# Patient Record
Sex: Female | Born: 1975 | Race: Black or African American | Hispanic: No | Marital: Single | Smoking: Never smoker
Health system: Southern US, Community
[De-identification: ages and names within clinical notes are randomized; demographics above are authoritative.]

## PROBLEM LIST (undated history)

## (undated) DIAGNOSIS — J45909 Unspecified asthma, uncomplicated: Secondary | ICD-10-CM

## (undated) DIAGNOSIS — F419 Anxiety disorder, unspecified: Secondary | ICD-10-CM

---

## 2006-08-31 ENCOUNTER — Emergency Department: Payer: Self-pay | Admitting: Emergency Medicine

## 2006-08-31 ENCOUNTER — Other Ambulatory Visit: Payer: Self-pay

## 2006-12-17 ENCOUNTER — Other Ambulatory Visit: Payer: Self-pay

## 2006-12-17 ENCOUNTER — Emergency Department: Payer: Self-pay | Admitting: Unknown Physician Specialty

## 2007-05-07 ENCOUNTER — Emergency Department: Payer: Self-pay | Admitting: Emergency Medicine

## 2007-06-19 ENCOUNTER — Emergency Department: Payer: Self-pay | Admitting: Emergency Medicine

## 2007-06-19 ENCOUNTER — Other Ambulatory Visit: Payer: Self-pay

## 2007-07-11 ENCOUNTER — Emergency Department: Payer: Self-pay | Admitting: Emergency Medicine

## 2007-08-13 ENCOUNTER — Emergency Department: Payer: Self-pay | Admitting: Emergency Medicine

## 2007-10-02 ENCOUNTER — Other Ambulatory Visit: Payer: Self-pay

## 2007-10-02 ENCOUNTER — Emergency Department: Payer: Self-pay | Admitting: Emergency Medicine

## 2007-10-04 ENCOUNTER — Emergency Department: Payer: Self-pay | Admitting: Internal Medicine

## 2007-11-30 ENCOUNTER — Emergency Department: Payer: Self-pay | Admitting: Emergency Medicine

## 2008-08-30 ENCOUNTER — Emergency Department: Payer: Self-pay | Admitting: Emergency Medicine

## 2009-01-29 ENCOUNTER — Emergency Department: Payer: Self-pay | Admitting: Emergency Medicine

## 2009-11-03 ENCOUNTER — Emergency Department: Payer: Self-pay | Admitting: Unknown Physician Specialty

## 2010-03-17 ENCOUNTER — Emergency Department: Payer: Self-pay | Admitting: Emergency Medicine

## 2010-10-15 ENCOUNTER — Emergency Department: Payer: Self-pay | Admitting: Unknown Physician Specialty

## 2011-05-01 ENCOUNTER — Emergency Department: Payer: Self-pay | Admitting: Emergency Medicine

## 2011-12-10 ENCOUNTER — Emergency Department: Payer: Self-pay | Admitting: Emergency Medicine

## 2014-04-25 ENCOUNTER — Emergency Department: Payer: Self-pay | Admitting: Emergency Medicine

## 2015-06-09 ENCOUNTER — Emergency Department: Payer: Managed Care, Other (non HMO)

## 2015-06-09 ENCOUNTER — Encounter: Payer: Self-pay | Admitting: Emergency Medicine

## 2015-06-09 ENCOUNTER — Emergency Department
Admission: EM | Admit: 2015-06-09 | Discharge: 2015-06-09 | Disposition: A | Discharge status: Home / Self Care | Payer: Managed Care, Other (non HMO) | Attending: Emergency Medicine | Admitting: Emergency Medicine

## 2015-06-09 ENCOUNTER — Other Ambulatory Visit: Payer: Self-pay

## 2015-06-09 DIAGNOSIS — Z3202 Encounter for pregnancy test, result negative: Secondary | ICD-10-CM | POA: Diagnosis not present

## 2015-06-09 DIAGNOSIS — J209 Acute bronchitis, unspecified: Secondary | ICD-10-CM | POA: Insufficient documentation

## 2015-06-09 DIAGNOSIS — R05 Cough: Secondary | ICD-10-CM | POA: Diagnosis present

## 2015-06-09 HISTORY — DX: Unspecified asthma, uncomplicated: J45.909

## 2015-06-09 LAB — COMPREHENSIVE METABOLIC PANEL
ALBUMIN: 4 g/dL (ref 3.5–5.0)
ALK PHOS: 49 U/L (ref 38–126)
ALT: 28 U/L (ref 14–54)
ANION GAP: 6 (ref 5–15)
AST: 28 U/L (ref 15–41)
BILIRUBIN TOTAL: 0.2 mg/dL — AB (ref 0.3–1.2)
BUN: 7 mg/dL (ref 6–20)
CO2: 29 mmol/L (ref 22–32)
Calcium: 9.5 mg/dL (ref 8.9–10.3)
Chloride: 105 mmol/L (ref 101–111)
Creatinine, Ser: 0.85 mg/dL (ref 0.44–1.00)
GFR calc Af Amer: >60 mL/min (ref >60)
GFR calc non Af Amer: >60 mL/min (ref >60)
GLUCOSE: 126 mg/dL — AB (ref 65–99)
POTASSIUM: 3.6 mmol/L (ref 3.5–5.1)
SODIUM: 140 mmol/L (ref 135–145)
TOTAL PROTEIN: 7.2 g/dL (ref 6.5–8.1)

## 2015-06-09 LAB — CBC
HCT: 36.6 % (ref 35.0–47.0)
Hemoglobin: 12.3 g/dL (ref 12.0–16.0)
MCH: 30.8 pg (ref 26.0–34.0)
MCHC: 33.5 g/dL (ref 32.0–36.0)
MCV: 91.9 fL (ref 80.0–100.0)
PLATELETS: 216 10*3/uL (ref 150–440)
RBC: 3.98 MIL/uL (ref 3.80–5.20)
RDW: 13 % (ref 11.5–14.5)
WBC: 5.5 10*3/uL (ref 3.6–11.0)

## 2015-06-09 LAB — TROPONIN I
Troponin I: <0.03 ng/mL (ref <0.031)
Troponin I: <0.03 ng/mL (ref <0.031)

## 2015-06-09 LAB — POCT PREGNANCY, URINE: PREG TEST UR: NEGATIVE

## 2015-06-09 LAB — FIBRIN DERIVATIVES D-DIMER (ARMC ONLY): FIBRIN DERIVATIVES D-DIMER (ARMC): 496 (ref 0–499)

## 2015-06-09 MED ORDER — AZITHROMYCIN 250 MG PO TABS: 500.0000 mg | ORAL_TABLET | Freq: Every day | ORAL | Status: AC

## 2015-06-09 MED ORDER — AZITHROMYCIN 250 MG PO TABS
500.0000 mg | ORAL_TABLET | Freq: Once | ORAL | Status: AC
  Administered 2015-06-09: 500 mg via ORAL
  Filled 2015-06-09: qty 2

## 2015-06-09 NOTE — ED Notes (Signed)
Pt to ED with c/o left sided chest pain that has been intermitted x 2 days, also states today she had some left arm pain, denies any pain at present, also has had productive cough and dizziness

## 2015-06-09 NOTE — ED Provider Notes (Signed)
[facility] Emergency Department Provider Note  ____________________________________________  Time seen: 7:45 PM  I have reviewed the triage vital signs and the nursing notes.   HISTORY  Chief Complaint Chest Pain      HPI SHAMICA MOREE is a 39 y.o. female presents with intermittent left-sided chest pain 2 days accompanied by dizziness. In addition patient admits to a productive cough with onset 3 days ago. Patient denies any fever no nausea vomiting or diaphoresis. Patient states of note that her 68-year-old son was recently diagnosed with bronchitis. Regarding cardiac potential etiology patient has a family history of father has had 7 myocardial infarction his first of which was at the age of 47. Patient denies any leg pain or swelling.    Past Medical History  Diagnosis Date  . Asthma     There are no active problems to display for this patient.   Past Surgical History  Procedure Laterality Date  . Cesarean section      No current outpatient prescriptions on file.  Allergies Sulfa antibiotics  No family history on file.  Social History Social History  Substance Use Topics  . Smoking status: Never Smoker   . Smokeless tobacco: None  . Alcohol Use: No    Review of Systems  Constitutional: Negative for fever. Eyes: Negative for visual changes. ENT: Negative for sore throat. Cardiovascular: Positive for chest pain. Respiratory: Positive for shortness of breath and cough Gastrointestinal: Negative for abdominal pain, vomiting and diarrhea. Genitourinary: Negative for dysuria. Musculoskeletal: Negative for back pain. Skin: Negative for rash. Neurological: Negative for headaches, focal weakness or numbness.   10-point ROS otherwise negative.  ____________________________________________   PHYSICAL EXAM:  VITAL SIGNS: ED Triage Vitals  Enc Vitals Group     BP 06/09/15 1631 129/71 mmHg     Pulse Rate 06/09/15 1631 88   Resp 06/09/15 1631 18     Temp 06/09/15 1631 98.1 F (36.7 C)     Temp Source 06/09/15 1631 Oral     SpO2 06/09/15 1631 98 %     Weight 06/09/15 1631 133 lb (60.328 kg)     Height 06/09/15 1631 5' (1.524 m)     Head Cir --      Peak Flow --      Pain Score --      Pain Loc --      Pain Edu? --      Excl. in GC? --      Constitutional: Alert and oriented. Well appearing and in no distress. Eyes: Conjunctivae are normal. PERRL. Normal extraocular movements. ENT   Head: Normocephalic and atraumatic.   Nose: No congestion/rhinnorhea.   Mouth/Throat: Mucous membranes are moist.   Neck: No stridor. Hematological/Lymphatic/Immunilogical: No cervical lymphadenopathy. Cardiovascular: Normal rate, regular rhythm. Normal and symmetric distal pulses are present in all extremities. No murmurs, rubs, or gallops. Respiratory: Normal respiratory effort without tachypnea nor retractions. Breath sounds are clear and equal bilaterally. No wheezes/rales/rhonchi. Gastrointestinal: Soft and nontender. No distention. There is no CVA tenderness. Genitourinary: deferred Musculoskeletal: Nontender with normal range of motion in all extremities. No joint effusions.  No lower extremity tenderness nor edema. Neurologic:  Normal speech and language. No gross focal neurologic deficits are appreciated. Speech is normal.  Skin:  Skin is warm, dry and intact. No rash noted. Psychiatric: Mood and affect are normal. Speech and behavior are normal. Patient exhibits appropriate insight and judgment.  ____________________________________________    LABS (pertinent positives/negatives)  Labs Reviewed  COMPREHENSIVE  METABOLIC PANEL - Abnormal; Notable for the following:    Glucose, Bld 126 (*)    Total Bilirubin 0.2 (*)    All other components within normal limits  CBC  TROPONIN I  TROPONIN I  FIBRIN DERIVATIVES D-DIMER ([facility] ONLY)  POC URINE PREG, ED     ___  RADIOLOGY    DG Chest 2 View  (Final result) Result time: 06/09/15 17:12:12   Final result by Rad Results In Interface (06/09/15 17:12:12)   Narrative:   CLINICAL DATA: Chest pain and productive cough for 2 days.  EXAM: CHEST 2 VIEW  COMPARISON: None.  FINDINGS: The cardiac silhouette, mediastinal and hilar contours are within normal limits. The lungs are clear. No pleural effusion. The bony thorax is intact.  IMPRESSION: No acute cardiopulmonary findings.   Electronically Signed By: Rudie Meyer M.D. On: 06/09/2015 17:12         INITIAL IMPRESSION / ASSESSMENT AND PLAN / ED COURSE  Pertinent labs & imaging results that were available during my care of the patient were reviewed by me and considered in my medical decision making (see chart for details).  Patient's cardiac enzymes negative 2 along with unremarkable EKG. I suspect the patient's pain is most likely secondary to bronchitis given sick contact with the same and productive cough.  ____________________________________________   FINAL CLINICAL IMPRESSION(S) / ED DIAGNOSES  Final diagnoses:  Acute bronchitis, unspecified organism        Darci Current, MD 06/09/15 2155

## 2015-06-09 NOTE — Discharge Instructions (Signed)

## 2015-09-09 ENCOUNTER — Encounter: Payer: Self-pay | Admitting: *Deleted

## 2015-09-09 ENCOUNTER — Emergency Department: Payer: Managed Care, Other (non HMO)

## 2015-09-09 ENCOUNTER — Emergency Department
Admission: EM | Admit: 2015-09-09 | Discharge: 2015-09-09 | Disposition: A | Discharge status: Home / Self Care | Payer: Managed Care, Other (non HMO) | Attending: Emergency Medicine | Admitting: Emergency Medicine

## 2015-09-09 DIAGNOSIS — M546 Pain in thoracic spine: Secondary | ICD-10-CM | POA: Diagnosis not present

## 2015-09-09 DIAGNOSIS — M79602 Pain in left arm: Secondary | ICD-10-CM | POA: Diagnosis present

## 2015-09-09 DIAGNOSIS — R079 Chest pain, unspecified: Secondary | ICD-10-CM | POA: Diagnosis not present

## 2015-09-09 LAB — BASIC METABOLIC PANEL
ANION GAP: 7 (ref 5–15)
BUN: 10 mg/dL (ref 6–20)
CALCIUM: 9.3 mg/dL (ref 8.9–10.3)
CO2: 28 mmol/L (ref 22–32)
Chloride: 105 mmol/L (ref 101–111)
Creatinine, Ser: 0.77 mg/dL (ref 0.44–1.00)
Glucose, Bld: 104 mg/dL — ABNORMAL HIGH (ref 65–99)
Potassium: 3.5 mmol/L (ref 3.5–5.1)
SODIUM: 140 mmol/L (ref 135–145)

## 2015-09-09 LAB — CBC
HCT: 39.4 % (ref 35.0–47.0)
HEMOGLOBIN: 12.7 g/dL (ref 12.0–16.0)
MCH: 29.9 pg (ref 26.0–34.0)
MCHC: 32.3 g/dL (ref 32.0–36.0)
MCV: 92.6 fL (ref 80.0–100.0)
Platelets: 274 10*3/uL (ref 150–440)
RBC: 4.25 MIL/uL (ref 3.80–5.20)
RDW: 13.2 % (ref 11.5–14.5)
WBC: 5.3 10*3/uL (ref 3.6–11.0)

## 2015-09-09 LAB — TROPONIN I

## 2015-09-09 MED ORDER — IBUPROFEN 800 MG PO TABS: 800.0000 mg | ORAL_TABLET | Freq: Three times a day (TID) | ORAL | Status: DC | PRN

## 2015-09-09 MED ORDER — DIAZEPAM 5 MG PO TABS: 5.0000 mg | ORAL_TABLET | Freq: Three times a day (TID) | ORAL | Status: DC | PRN

## 2015-09-09 NOTE — ED Provider Notes (Signed)
[facility] Emergency Department Provider Note     Time seen: ----------------------------------------- 9:39 AM on 09/09/2015 -----------------------------------------    I have reviewed the triage vital signs and the nursing notes.   HISTORY  Chief Complaint Chest Pain and Arm Pain    HPI Wendy Collins is a 39 y.o. female who presents ER for left arm pain and chest pain started this morning. Patient has never had a history of this before, has had chest pain in the past. The chest pain today is more like back pain. She states this time appears hard for her, but doesn't really think this is anxiety related. She denies any recent illness or cough. Nothing makes her symptoms better or worse.   Past Medical History  Diagnosis Date  . Asthma     There are no active problems to display for this patient.   Past Surgical History  Procedure Laterality Date  . Cesarean section      Allergies Sulfa antibiotics  Social History Social History  Substance Use Topics  . Smoking status: Never Smoker   . Smokeless tobacco: None  . Alcohol Use: No    Review of Systems Constitutional: Negative for fever. Eyes: Negative for visual changes. ENT: Negative for sore throat. Cardiovascular: Positive for chest pain Respiratory: Negative for shortness of breath. Gastrointestinal: Negative for abdominal pain, vomiting and diarrhea. Genitourinary: Negative for dysuria. Musculoskeletal: Positive for back pain, left arm pain Skin: Negative for rash. Neurological: Negative for headaches, focal weakness or numbness.  10-point ROS otherwise negative.  ____________________________________________   PHYSICAL EXAM:  VITAL SIGNS: ED Triage Vitals  Enc Vitals Group     BP 09/09/15 0929 129/79 mmHg     Pulse Rate 09/09/15 0929 79     Resp 09/09/15 0929 20     Temp 09/09/15 0929 97.6 F (36.4 C)     Temp Source 09/09/15 0929 Oral     SpO2 09/09/15 0929  100 %     Weight 09/09/15 0929 126 lb (57.153 kg)     Height 09/09/15 0929 5' (1.524 m)     Head Cir --      Peak Flow --      Pain Score 09/09/15 0931 5     Pain Loc --      Pain Edu? --      Excl. in GC? --     Constitutional: Alert and oriented. Well appearing and in no distress. Eyes: Conjunctivae are normal. PERRL. Normal extraocular movements. ENT   Head: Normocephalic and atraumatic.   Nose: No congestion/rhinnorhea.   Mouth/Throat: Mucous membranes are moist.   Neck: No stridor. Cardiovascular: Normal rate, regular rhythm. Normal and symmetric distal pulses are present in all extremities. No murmurs, rubs, or gallops. Respiratory: Normal respiratory effort without tachypnea nor retractions. Breath sounds are clear and equal bilaterally. No wheezes/rales/rhonchi. Gastrointestinal: Soft and nontender. No distention. No abdominal bruits.  Musculoskeletal: Nontender with normal range of motion in all extremities. No joint effusions.  No lower extremity tenderness nor edema. Neurologic:  Normal speech and language. No gross focal neurologic deficits are appreciated. Speech is normal. No gait instability. Skin:  Skin is warm, dry and intact. No rash noted. Psychiatric: Mood and affect are normal. Speech and behavior are normal. Patient exhibits appropriate insight and judgment. ____________________________________________  EKG: Interpreted by me. Normal sinus rhythm with sinus arrhythmia. Rate is 80 bpm, normal PR interval, normal QS with, normal QT interval. Possible septal infarct age and determinate. Nonspecific T-wave  changes.  ____________________________________________  ED COURSE:  Pertinent labs & imaging results that were available during my care of the patient were reviewed by me and considered in my medical decision making (see chart for details). Patient looks well, low risk for ACS. She does have a family history of cardiac disease but no other risk  factors personally ____________________________________________    LABS (pertinent positives/negatives)  Labs Reviewed  BASIC METABOLIC PANEL - Abnormal; Notable for the following:    Glucose, Bld 104 (*)    All other components within normal limits  CBC  TROPONIN I    RADIOLOGY  Chest x-ray IMPRESSION: No edema or consolidation ____________________________________________  FINAL ASSESSMENT AND PLAN  Back pain, left arm pain  Plan: Patient with labs and imaging as dictated above. Patient is low risk heart score and therefore low risk of ACS. This is likely anxiety or musculoskeletal in origin. I will prescribe Motrin and Valium for her to take as needed only.   Emily FilbertWilliams, Jonathan E, MD   Emily FilbertJonathan E Williams, MD 09/09/15 [phone]

## 2015-09-09 NOTE — ED Notes (Signed)
Pt reports left arm pain with chest pain started this morning, pt denies any other symptoms

## 2015-09-09 NOTE — Discharge Instructions (Signed)
Back Pain, Adult °Back pain is very common in adults. The cause of back pain is rarely dangerous and the pain often gets better over time. The cause of your back pain may not be known. Some common causes of back pain include: °· Strain of the muscles or ligaments supporting the spine. °· Wear and tear (degeneration) of the spinal disks. °· Arthritis. °· Direct injury to the back. °For many people, back pain may return. Since back pain is rarely dangerous, most people can learn to manage this condition on their own. °HOME CARE INSTRUCTIONS °Watch your back pain for any changes. The following actions may help to lessen any discomfort you are feeling: °· Remain active. It is stressful on your back to sit or stand in one place for long periods of time. Do not sit, drive, or stand in one place for more than 30 minutes at a time. Take short walks on even surfaces as soon as you are able. Try to increase the length of time you walk each day. °· Exercise regularly as directed by your health care provider. Exercise helps your back heal faster. It also helps avoid future injury by keeping your muscles strong and flexible. °· Do not stay in bed. Resting more than 1-2 days can delay your recovery. °· Pay attention to your body when you bend and lift. The most comfortable positions are those that put less stress on your recovering back. Always use proper lifting techniques, including: °· Bending your knees. °· Keeping the load close to your body. °· Avoiding twisting. °· Find a comfortable position to sleep. Use a firm mattress and lie on your side with your knees slightly bent. If you lie on your back, put a pillow under your knees. °· Avoid feeling anxious or stressed. Stress increases muscle tension and can worsen back pain. It is important to recognize when you are anxious or stressed and learn ways to manage it, such as with exercise. °· Take medicines only as directed by your health care provider. Over-the-counter  medicines to reduce pain and inflammation are often the most helpful. Your health care provider may prescribe muscle relaxant drugs. These medicines help dull your pain so you can more quickly return to your normal activities and healthy exercise. °· Apply ice to the injured area: °· Put ice in a plastic bag. °· Place a towel between your skin and the bag. °· Leave the ice on for 20 minutes, 2-3 times a day for the first 2-3 days. After that, ice and heat may be alternated to reduce pain and spasms. °· Maintain a healthy weight. Excess weight puts extra stress on your back and makes it difficult to maintain good posture. °SEEK MEDICAL CARE IF: °· You have pain that is not relieved with rest or medicine. °· You have increasing pain going down into the legs or buttocks. °· You have pain that does not improve in one week. °· You have night pain. °· You lose weight. °· You have a fever or chills. °SEEK IMMEDIATE MEDICAL CARE IF:  °· You develop new bowel or bladder control problems. °· You have unusual weakness or numbness in your arms or legs. °· You develop nausea or vomiting. °· You develop abdominal pain. °· You feel faint. °  °This information is not intended to replace advice given to you by your health care provider. Make sure you discuss any questions you have with your health care provider. °  °Document Released: 09/10/2005 Document Revised: 10/01/2014 Document Reviewed: 01/12/2014 °Elsevier Interactive Patient Education ©2016 [employer] °Thoracic Strain °A thoracic strain, which is sometimes called a mid-back strain, is an injury to the muscles or tendons that attach to the upper part of your back behind your chest. This type of injury occurs when a muscle is overstretched or overloaded.  °Thoracic strains can range from mild to severe. Mild strains may involve stretching a muscle or tendon without tearing it. These injuries may heal in 1-2 weeks. More severe strains involve tearing of muscle fibers or  tendons. These will cause more pain and may take 6-8 weeks to heal. °CAUSES °This condition may be caused by: °· An injury in which a sudden force is placed on the muscle. °· Exercising without properly warming up. °· Overuse of the muscle. °· Improper form during certain movements. °· Other injuries that surround or cause stress on the mid-back, causing a strain on the muscles. °In some cases, the cause may not be known. °RISK FACTORS °This injury is more common in: °· Athletes. °· People with obesity. °SYMPTOMS °The main symptom of this condition is pain, especially with movement. Other symptoms include: °· Bruising. °· Swelling. °· Spasm. °DIAGNOSIS °This condition may be diagnosed with a physical exam. X-rays may be taken to check for a fracture. °TREATMENT °This condition may be treated with: °· Resting and icing the injured area. °· Physical therapy. This will involve doing stretching and strengthening exercises. °· Medicines for pain and inflammation. °HOME CARE INSTRUCTIONS °· Rest as needed. Follow instructions from your health care provider about any restrictions on activity. °· If directed, apply ice to the injured area: °¨ Put ice in a plastic bag. °¨ Place a towel between your skin and the bag. °¨ Leave the ice on for 20 minutes, 2-3 times per day. °· Take over-the-counter and prescription medicines only as told by your health care provider. °· Begin doing exercises as told by your health care provider or physical therapist. °· Always warm up properly before physical activity or sports. °· Bend your knees before you lift heavy objects. °· Keep all follow-up visits as told by your health care provider. This is important. °SEEK MEDICAL CARE IF: °· Your pain is not helped by medicine. °· Your pain, bruising, or swelling is getting worse. °· You have a fever. °SEEK IMMEDIATE MEDICAL CARE IF: °· You have shortness of breath. °· You have chest pain. °· You develop numbness or weakness in your legs. °· You  have involuntary loss of urine (urinary incontinence). °  °This information is not intended to replace advice given to you by your health care provider. Make sure you discuss any questions you have with your health care provider. °  °Document Released: 12/01/2003 Document Revised: 06/01/2015 Document Reviewed: 11/04/2014 °Elsevier Interactive Patient Education ©2016 [employer] Inc. ° °

## 2015-09-09 NOTE — ED Notes (Signed)
Pt back from x-ray.

## 2015-10-11 ENCOUNTER — Emergency Department: Payer: Managed Care, Other (non HMO)

## 2015-10-11 ENCOUNTER — Encounter: Payer: Self-pay | Admitting: Emergency Medicine

## 2015-10-11 ENCOUNTER — Emergency Department
Admission: EM | Admit: 2015-10-11 | Discharge: 2015-10-11 | Disposition: A | Discharge status: Home / Self Care | Payer: Managed Care, Other (non HMO) | Attending: Emergency Medicine | Admitting: Emergency Medicine

## 2015-10-11 DIAGNOSIS — S30811A Abrasion of abdominal wall, initial encounter: Secondary | ICD-10-CM | POA: Insufficient documentation

## 2015-10-11 DIAGNOSIS — Z3202 Encounter for pregnancy test, result negative: Secondary | ICD-10-CM | POA: Insufficient documentation

## 2015-10-11 DIAGNOSIS — Y998 Other external cause status: Secondary | ICD-10-CM | POA: Diagnosis not present

## 2015-10-11 DIAGNOSIS — S2091XA Abrasion of unspecified parts of thorax, initial encounter: Secondary | ICD-10-CM

## 2015-10-11 DIAGNOSIS — S3991XA Unspecified injury of abdomen, initial encounter: Secondary | ICD-10-CM | POA: Diagnosis present

## 2015-10-11 DIAGNOSIS — S80212A Abrasion, left knee, initial encounter: Secondary | ICD-10-CM | POA: Diagnosis not present

## 2015-10-11 DIAGNOSIS — Y92410 Unspecified street and highway as the place of occurrence of the external cause: Secondary | ICD-10-CM | POA: Insufficient documentation

## 2015-10-11 DIAGNOSIS — S4992XA Unspecified injury of left shoulder and upper arm, initial encounter: Secondary | ICD-10-CM | POA: Diagnosis not present

## 2015-10-11 DIAGNOSIS — R109 Unspecified abdominal pain: Secondary | ICD-10-CM

## 2015-10-11 DIAGNOSIS — Y9389 Activity, other specified: Secondary | ICD-10-CM | POA: Insufficient documentation

## 2015-10-11 LAB — POCT PREGNANCY, URINE: Preg Test, Ur: NEGATIVE

## 2015-10-11 MED ORDER — CYCLOBENZAPRINE HCL 10 MG PO TABS: 10.0000 mg | ORAL_TABLET | Freq: Every day | ORAL | Status: AC

## 2015-10-11 MED ORDER — NAPROXEN 500 MG PO TABS: 500.0000 mg | ORAL_TABLET | Freq: Two times a day (BID) | ORAL | Status: AC
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
## 2015-10-11 NOTE — Discharge Instructions (Signed)
Abrasion An abrasion is a cut or scrape on the surface of your skin. An abrasion does not go through all of the layers of your skin. It is important to take good care of your abrasion to prevent infection. HOME CARE Medicines  Take or apply medicines only as told by your doctor.  If you were prescribed an antibiotic ointment, finish all of it even if you start to feel better. Wound Care  Clean the wound with mild soap and water 2-3 times per day or as told by your doctor. Pat your wound dry with a clean towel. Do not rub it.  There are many ways to close and cover a wound. Follow instructions from your doctor about:  How to take care of your wound.  When and how you should change your bandage (dressing).  When and how you should take off your dressing.  Check your wound every day for signs of infection. Watch for:  Redness, swelling, or pain.  Fluid, blood, or pus. General Instructions  Keep the dressing dry as told by your doctor. Do not take baths, swim, use a hot tub, or do anything that would put your wound underwater until your doctor says it is okay.  If there is swelling, raise (elevate) the injured area above the level of your heart while you are sitting or lying down.  Keep all follow-up visits as told by your doctor. This is important. GET HELP IF:  You were given a tetanus shot and you have any of these where the needle went in:  Swelling.  Very bad pain.  Redness.  Bleeding.  Medicine does not help your pain.  You have any of these at the site of the wound:  More redness.  More swelling.  More pain. GET HELP RIGHT AWAY IF:  You have a red streak going away from your wound.  You have a fever.  You have fluid, blood, or pus coming from your wound.  There is a bad smell coming from your wound.   This information is not intended to replace advice given to you by your health care provider. Make sure you discuss any questions you have with your  health care provider.   Document Released: 02/27/2008 Document Revised: 01/25/2015 Document Reviewed: 09/08/2014 Elsevier Interactive Patient Education 2016 [employer]  Cancer Antigen 19-9 Some forms of cancer produce a protein that appears in the blood when you have the disease (cancer marker or tumor marker). Measuring this protein can help diagnose certain types of cancer. Cancer antigen 19-9 (CA 19-9) is a cancer marker for pancreatic cancer. CA 19-9 is consistently higher in people with advanced pancreatic cancer. CA 19-9 can also be higher in people with other cancers and diseases. Sometimes people who do not have cancer or another disease have a small amount of CA 19-9 in their blood. You may have this test if you have symptoms of pancreatic cancer, such as:   Jaundice.  Weight loss.  Nausea.  Abdominal pain. If you are diagnosed with pancreatic cancer, you may also have this test to monitor your treatment. If your level of CA 19-9 continues to go up, it could mean your treatment is not working. If your level of CA 19-9 gets lower, the treatment is most likely working. You may also have this test to determine whether a cancer has come back after treatment.  This test requires a blood sample drawn from a vein in your hand or arm. RESULTS It is your responsibility  to obtain your test results. Ask the lab or department performing the test when and how you will get your results. Contact your health care provider to discuss any questions you have about your results.  The results of this test will be a range of values. CA 19-9 is measured in units per milliliter (units/mL).  Range of Normal Values Ranges for normal values vary among different labs and hospitals. You should always check with your health care provider after having lab work or other tests done to discuss whether your values are considered within normal limits.  A normal value for CA 19-9 is 37 units/mL or less. A level  that is outside the range of normal may indicate a problem. Meaning of Results Outside Normal Range Values A level of CA 19-9 in your blood that is higher than 37 units/mL could indicate you have pancreatic cancer. It can also mean you have another type of cancer. These include:  Lung cancer.  Gallbladder cancer.  Colorectal cancer. A high level of CA 19-9 can also indicate a condition that is not cancer, such as:   Liver disease.  Cystic fibrosis.  Pancreatitis.  Gallstones.   This information is not intended to replace advice given to you by your health care provider. Make sure you discuss any questions you have with your health care provider.   Document Released: 10/02/2004 Document Revised: 10/01/2014 Document Reviewed: 01/06/2014 Elsevier Interactive Patient Education 2016 [employer]  Abrasion An abrasion is a cut or scrape on the outer surface of your skin. An abrasion does not extend through all of the layers of your skin. It is important to care for your abrasion properly to prevent infection. CAUSES Most abrasions are caused by falling on or gliding across the ground or another surface. When your skin rubs on something, the outer and inner layer of skin rubs off.  SYMPTOMS A cut or scrape is the main symptom of this condition. The scrape may be bleeding, or it may appear red or pink. If there was an associated fall, there may be an underlying bruise. DIAGNOSIS An abrasion is diagnosed with a physical exam. TREATMENT Treatment for this condition depends on how large and deep the abrasion is. Usually, your abrasion will be cleaned with water and mild soap. This removes any dirt or debris that may be stuck. An antibiotic ointment may be applied to the abrasion to help prevent infection. A bandage (dressing) may be placed on the abrasion to keep it clean. You may also need a tetanus shot. HOME CARE INSTRUCTIONS Medicines  Take or apply medicines only as directed by  your health care provider.  If you were prescribed an antibiotic ointment, finish all of it even if you start to feel better. Wound Care  Clean the wound with mild soap and water 2-3 times per day or as directed by your health care provider. Pat your wound dry with a clean towel. Do not rub it.  There are many different ways to close and cover a wound. Follow instructions from your health care provider about:  Wound care.  Dressing changes and removal.  Check your wound every day for signs of infection. Watch for:  Redness, swelling, or pain.  Fluid, blood, or pus. General Instructions  Keep the dressing dry as directed by your health care provider. Do not take baths, swim, use a hot tub, or do anything that would put your wound underwater until your health care provider approves.  If there is swelling,  raise (elevate) the injured area above the level of your heart while you are sitting or lying down.  Keep all follow-up visits as directed by your health care provider. This is important. SEEK MEDICAL CARE IF:  You received a tetanus shot and you have swelling, severe pain, redness, or bleeding at the injection site.  Your pain is not controlled with medicine.  You have increased redness, swelling, or pain at the site of your wound. SEEK IMMEDIATE MEDICAL CARE IF:  You have a red streak going away from your wound.  You have a fever.  You have fluid, blood, or pus coming from your wound.  You notice a bad smell coming from your wound or your dressing.   This information is not intended to replace advice given to you by your health care provider. Make sure you discuss any questions you have with your health care provider.   Document Released: 06/20/2005 Document Revised: 06/01/2015 Document Reviewed: 09/08/2014 Elsevier Interactive Patient Education [employer]
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
## 2015-12-13 ENCOUNTER — Emergency Department: Payer: Managed Care, Other (non HMO)

## 2015-12-13 ENCOUNTER — Emergency Department
Admission: EM | Admit: 2015-12-13 | Discharge: 2015-12-13 | Disposition: A | Discharge status: Home / Self Care | Payer: Managed Care, Other (non HMO) | Attending: Emergency Medicine | Admitting: Emergency Medicine

## 2015-12-13 ENCOUNTER — Encounter: Payer: Self-pay | Admitting: Emergency Medicine

## 2015-12-13 DIAGNOSIS — Z79899 Other long term (current) drug therapy: Secondary | ICD-10-CM | POA: Diagnosis not present

## 2015-12-13 DIAGNOSIS — R42 Dizziness and giddiness: Secondary | ICD-10-CM | POA: Diagnosis not present

## 2015-12-13 DIAGNOSIS — M25519 Pain in unspecified shoulder: Secondary | ICD-10-CM | POA: Insufficient documentation

## 2015-12-13 DIAGNOSIS — R079 Chest pain, unspecified: Secondary | ICD-10-CM

## 2015-12-13 DIAGNOSIS — Z791 Long term (current) use of non-steroidal anti-inflammatories (NSAID): Secondary | ICD-10-CM | POA: Diagnosis not present

## 2015-12-13 DIAGNOSIS — R51 Headache: Secondary | ICD-10-CM | POA: Diagnosis not present

## 2015-12-13 LAB — CBC
HCT: 39.4 % (ref 35.0–47.0)
HEMOGLOBIN: 13.4 g/dL (ref 12.0–16.0)
MCH: 30.6 pg (ref 26.0–34.0)
MCHC: 34 g/dL (ref 32.0–36.0)
MCV: 90.1 fL (ref 80.0–100.0)
PLATELETS: 274 10*3/uL (ref 150–440)
RBC: 4.37 MIL/uL (ref 3.80–5.20)
RDW: 12.6 % (ref 11.5–14.5)
WBC: 8 10*3/uL (ref 3.6–11.0)

## 2015-12-13 LAB — BASIC METABOLIC PANEL
ANION GAP: 10 (ref 5–15)
BUN: 10 mg/dL (ref 6–20)
CHLORIDE: 100 mmol/L — AB (ref 101–111)
CO2: 22 mmol/L (ref 22–32)
Calcium: 8.2 mg/dL — ABNORMAL LOW (ref 8.9–10.3)
Creatinine, Ser: 0.84 mg/dL (ref 0.44–1.00)
GFR calc non Af Amer: >60 mL/min (ref >60)
Glucose, Bld: 108 mg/dL — ABNORMAL HIGH (ref 65–99)
Potassium: 2.6 mmol/L — CL (ref 3.5–5.1)
SODIUM: 132 mmol/L — AB (ref 135–145)

## 2015-12-13 LAB — TROPONIN I: Troponin I: <0.03 ng/mL (ref <0.031)

## 2015-12-13 MED ORDER — POTASSIUM CHLORIDE CRYS ER 20 MEQ PO TBCR
40.0000 meq | EXTENDED_RELEASE_TABLET | Freq: Once | ORAL | Status: AC
  Administered 2015-12-13: 40 meq via ORAL

## 2015-12-13 MED ORDER — POTASSIUM CHLORIDE CRYS ER 20 MEQ PO TBCR
40.0000 meq | EXTENDED_RELEASE_TABLET | Freq: Once | ORAL | Status: AC
  Administered 2015-12-13: 40 meq via ORAL
  Filled 2015-12-13: qty 2

## 2015-12-13 MED ORDER — IBUPROFEN 800 MG PO TABS
800.0000 mg | ORAL_TABLET | Freq: Once | ORAL | Status: AC
  Administered 2015-12-13: 800 mg via ORAL
  Filled 2015-12-13: qty 1

## 2015-12-13 MED ORDER — POTASSIUM CHLORIDE CRYS ER 20 MEQ PO TBCR
EXTENDED_RELEASE_TABLET | ORAL | Status: AC
  Administered 2015-12-13: 40 meq via ORAL
  Filled 2015-12-13: qty 2

## 2015-12-13 NOTE — ED Notes (Signed)
Patient ambulatory to triage with steady gait, without difficulty or distress noted; pt reports left upper chest pain since last Thursday;denies accomp symptoms

## 2015-12-13 NOTE — ED Notes (Signed)
Pt reports having stabbing pain in left chest area radiating around her left breast. Pt denies any other symptoms associated with her CP.

## 2015-12-13 NOTE — ED Provider Notes (Signed)
[facility] Emergency Department Provider Note  ____________________________________________  Time seen: Approximately 5:13 AM  I have reviewed the triage vital signs and the nursing notes.   HISTORY  Chief Complaint Chest Pain    HPI Wendy Collins is a 40 y.o. female who comes into the hospital today with some chest pain and tenderness since Thursday. The patient reports that the pain is in her left upper chest and radiates around her breast and into her back. The patient reports that at the sharp nagging pain which she hasn't taken anything. The patient reports that she's had some lightheadedness and headache but no shortness of breath, no nausea vomiting, no sweats. The patient reports her pain is a 5 out of 10 in intensity. She reports that it is tender when you palpate and she is unsure what going on. The patient denies trauma but does have a child that is almost 2 which she lifts on a regular basis. The patient reports that she does have an appointment to see her doctor in the morning but the pain was so severe last night she decided to come in and get checked out.   Past Medical History  Diagnosis Date  . Asthma     There are no active problems to display for this patient.   Past Surgical History  Procedure Laterality Date  . Cesarean section      Current Outpatient Rx  Name  Route  Sig  Dispense  Refill  . methylPREDNISolone (MEDROL DOSEPAK) 4 MG TBPK tablet      FPD      0   . cyclobenzaprine (FLEXERIL) 10 MG tablet   Oral   Take 1 tablet (10 mg total) by mouth at bedtime.   10 tablet   0   . naproxen (NAPROSYN) 500 MG tablet   Oral   Take 1 tablet (500 mg total) by mouth 2 (two) times daily with a meal.   30 tablet   0     Allergies Sulfa antibiotics  No family history on file.  Social History Social History  Substance Use Topics  . Smoking status: Never Smoker   . Smokeless tobacco: None  . Alcohol Use: No     Review of Systems Constitutional: No fever/chills Eyes: No visual changes. ENT: No sore throat. Cardiovascular:  chest pain. Respiratory: Denies shortness of breath. Gastrointestinal: No abdominal pain.  No nausea, no vomiting.  No diarrhea.  No constipation. Genitourinary: Negative for dysuria. Musculoskeletal: Scapula pain Skin: Negative for rash. Neurological: Headache and lightheadedness  10-point ROS otherwise negative.  ____________________________________________   PHYSICAL EXAM:  VITAL SIGNS: ED Triage Vitals  Enc Vitals Group     BP 12/13/15 0159 142/84 mmHg     Pulse Rate 12/13/15 0159 100     Resp 12/13/15 0159 20     Temp 12/13/15 0159 98 F (36.7 C)     Temp Source 12/13/15 0159 Oral     SpO2 12/13/15 0159 100 %     Weight 12/13/15 0159 133 lb (60.328 kg)     Height 12/13/15 0159 5' (1.524 m)     Head Cir --      Peak Flow --      Pain Score 12/13/15 0157 10     Pain Loc --      Pain Edu? --      Excl. in GC? --     Constitutional: Alert and oriented. Well appearing and in no acute distress. Eyes: Conjunctivae  are normal. PERRL. EOMI. Head: Atraumatic. Nose: No congestion/rhinnorhea. Mouth/Throat: Mucous membranes are moist.  Oropharynx non-erythematous. Cardiovascular: Normal rate, regular rhythm. Grossly normal heart sounds.  Good peripheral circulation. Left chest tender to palpation above breast and below breast as well as along the side and into the scapula. Respiratory: Normal respiratory effort.  No retractions. Lungs CTAB. Gastrointestinal: Soft and nontender. No distention. Positive bowel sounds Musculoskeletal: No lower extremity tenderness nor edema.   Neurologic:  Normal speech and language.  Skin:  Skin is warm, dry and intact. Marland Kitchen. Psychiatric: Mood and affect are normal.   ____________________________________________   LABS (all labs ordered are listed, but only abnormal results are displayed)  Labs Reviewed  BASIC METABOLIC  PANEL - Abnormal; Notable for the following:    Sodium 132 (*)    Potassium 2.6 (*)    Chloride 100 (*)    Glucose, Bld 108 (*)    Calcium 8.2 (*)    All other components within normal limits  CBC  TROPONIN I  TROPONIN I   ____________________________________________  EKG  ED ECG REPORT I, Rebecka ApleyWebster,  Hania Cerone P, the attending physician, personally viewed and interpreted this ECG.   Date: 12/13/2015  EKG Time: 202  Rate: 101  Rhythm: sinus tachycardia  Axis: normal  Intervals:none  ST&T Change: St segment depression II, III, avf, V3, V4, V5, V6  ____________________________________________  RADIOLOGY  CXR: No active cardiopulmonary disease ____________________________________________   PROCEDURES  Procedure(s) performed: None  Critical Care performed: No  ____________________________________________   INITIAL IMPRESSION / ASSESSMENT AND PLAN / ED COURSE  Pertinent labs & imaging results that were available during my care of the patient were reviewed by me and considered in my medical decision making (see chart for details).  This is a 40 year old female who comes to the hospital with chest pain. The patient was having tenderness in her chest wall and hasn't taken anything for the pain. The patient's potassium is low so I did give her dose of oral potassium 40 mEq. The patient also received a dose of ibuprofen. We'll reassess the patient's troponin reassess the patient afterwards.  The patient's repeat troponin is unremarkable. The patient did receive her second dose of oral potassium 40 mEq. The patient discharged home to follow-up with her primary care physician as scheduled today. ____________________________________________   FINAL CLINICAL IMPRESSION(S) / ED DIAGNOSES  Final diagnoses:  Chest pain, unspecified chest pain type      Rebecka ApleyAllison P Cyprian Gongaware, MD 12/13/15 [phone]

## 2015-12-13 NOTE — Discharge Instructions (Signed)
Nonspecific Chest Pain  °Chest pain can be caused by many different conditions. There is always a chance that your pain could be related to something serious, such as a heart attack or a blood clot in your lungs. Chest pain can also be caused by conditions that are not life-threatening. If you have chest pain, it is very important to follow up with your health care provider. °CAUSES  °Chest pain can be caused by: °· Heartburn. °· Pneumonia or bronchitis. °· Anxiety or stress. °· Inflammation around your heart (pericarditis) or lung (pleuritis or pleurisy). °· A blood clot in your lung. °· A collapsed lung (pneumothorax). It can develop suddenly on its own (spontaneous pneumothorax) or from trauma to the chest. °· Shingles infection (varicella-zoster virus). °· Heart attack. °· Damage to the bones, muscles, and cartilage that make up your chest wall. This can include: °¨ Bruised bones due to injury. °¨ Strained muscles or cartilage due to frequent or repeated coughing or overwork. °¨ Fracture to one or more ribs. °¨ Sore cartilage due to inflammation (costochondritis). °RISK FACTORS  °Risk factors for chest pain may include: °· Activities that increase your risk for trauma or injury to your chest. °· Respiratory infections or conditions that cause frequent coughing. °· Medical conditions or overeating that can cause heartburn. °· Heart disease or family history of heart disease. °· Conditions or health behaviors that increase your risk of developing a blood clot. °· Having had chicken pox (varicella zoster). °SIGNS AND SYMPTOMS °Chest pain can feel like: °· Burning or tingling on the surface of your chest or deep in your chest. °· Crushing, pressure, aching, or squeezing pain. °· Dull or sharp pain that is worse when you move, cough, or take a deep breath. °· Pain that is also felt in your back, neck, shoulder, or arm, or pain that spreads to any of these areas. °Your chest pain may come and go, or it may stay  constant. °DIAGNOSIS °Lab tests or other studies may be needed to find the cause of your pain. Your health care provider may have you take a test called an ambulatory ECG (electrocardiogram). An ECG records your heartbeat patterns at the time the test is performed. You may also have other tests, such as: °· Transthoracic echocardiogram (TTE). During echocardiography, sound waves are used to create a picture of all of the heart structures and to look at how blood flows through your heart. °· Transesophageal echocardiogram (TEE). This is a more advanced imaging test that obtains images from inside your body. It allows your health care provider to see your heart in finer detail. °· Cardiac monitoring. This allows your health care provider to monitor your heart rate and rhythm in real time. °· Holter monitor. This is a portable device that records your heartbeat and can help to diagnose abnormal heartbeats. It allows your health care provider to track your heart activity for several days, if needed. °· Stress tests. These can be done through exercise or by taking medicine that makes your heart beat more quickly. °· Blood tests. °· Imaging tests. °TREATMENT  °Your treatment depends on what is causing your chest pain. Treatment may include: °· Medicines. These may include: °¨ Acid blockers for heartburn. °¨ Anti-inflammatory medicine. °¨ Pain medicine for inflammatory conditions. °¨ Antibiotic medicine, if an infection is present. °¨ Medicines to dissolve blood clots. °¨ Medicines to treat coronary artery disease. °· Supportive care for conditions that do not require medicines. This may include: °¨ Resting. °¨ Applying heat   or cold packs to injured areas. °¨ Limiting activities until pain decreases. °HOME CARE INSTRUCTIONS °· If you were prescribed an antibiotic medicine, finish it all even if you start to feel better. °· Avoid any activities that bring on chest pain. °· Do not use any tobacco products, including  cigarettes, chewing tobacco, or electronic cigarettes. If you need help quitting, ask your health care provider. °· Do not drink alcohol. °· Take medicines only as directed by your health care provider. °· Keep all follow-up visits as directed by your health care provider. This is important. This includes any further testing if your chest pain does not go away. °· If heartburn is the cause for your chest pain, you may be told to keep your head raised (elevated) while sleeping. This reduces the chance that acid will go from your stomach into your esophagus. °· Make lifestyle changes as directed by your health care provider. These may include: °¨ Getting regular exercise. Ask your health care provider to suggest some activities that are safe for you. °¨ Eating a heart-healthy diet. A registered dietitian can help you to learn healthy eating options. °¨ Maintaining a healthy weight. °¨ Managing diabetes, if necessary. °¨ Reducing stress. °SEEK MEDICAL CARE IF: °· Your chest pain does not go away after treatment. °· You have a rash with blisters on your chest. °· You have a fever. °SEEK IMMEDIATE MEDICAL CARE IF:  °· Your chest pain is worse. °· You have an increasing cough, or you cough up blood. °· You have severe abdominal pain. °· You have severe weakness. °· You faint. °· You have chills. °· You have sudden, unexplained chest discomfort. °· You have sudden, unexplained discomfort in your arms, back, neck, or jaw. °· You have shortness of breath at any time. °· You suddenly start to sweat, or your skin gets clammy. °· You feel nauseous or you vomit. °· You suddenly feel light-headed or dizzy. °· Your heart begins to beat quickly, or it feels like it is skipping beats. °These symptoms may represent a serious problem that is an emergency. Do not wait to see if the symptoms will go away. Get medical help right away. Call your local emergency services (911 in the [place] Do not drive yourself to the hospital. °  °This  information is not intended to replace advice given to you by your health care provider. Make sure you discuss any questions you have with your health care provider. °  °Document Released: 06/20/2005 Document Revised: 10/01/2014 Document Reviewed: 04/16/2014 °Elsevier Interactive Patient Education ©2016 [employer] Inc[employer] ° °Chest Wall Pain °Chest wall pain is pain in or around the bones and muscles of your chest. Sometimes, an injury causes this pain. Sometimes, the cause may not be known. This pain may take several weeks or longer to get better. °HOME CARE INSTRUCTIONS  °Pay attention to any changes in your symptoms. Take these actions to help with your pain:  °· Rest as told by your health care provider.   °· Avoid activities that cause pain. These include any activities that use your chest muscles or your abdominal and side muscles to lift heavy items.    °· If directed, apply ice to the painful area: °¨ Put ice in a plastic bag. °¨ Place a towel between your skin and the bag. °¨ Leave the ice on for 20 minutes, 2-3 times per day. °· Take over-the-counter and prescription medicines only as told by your health care provider. °· Do not use tobacco products, including cigarettes,   chewing tobacco, and e-cigarettes. If you need help quitting, ask your health care provider. °· Keep all follow-up visits as told by your health care provider. This is important. °SEEK MEDICAL CARE IF: °· You have a fever. °· Your chest pain becomes worse. °· You have new symptoms. °SEEK IMMEDIATE MEDICAL CARE IF: °· You have nausea or vomiting. °· You feel sweaty or light-headed. °· You have a cough with phlegm (sputum) or you cough up blood. °· You develop shortness of breath. °  °This information is not intended to replace advice given to you by your health care provider. Make sure you discuss any questions you have with your health care provider. °  °Document Released: 09/10/2005 Document Revised: 06/01/2015 Document Reviewed:  12/06/2014 °Elsevier Interactive Patient Education ©2016 [employer]. ° °

## 2016-03-28 ENCOUNTER — Emergency Department
Admission: EM | Admit: 2016-03-28 | Discharge: 2016-03-28 | Disposition: A | Discharge status: Home / Self Care | Payer: Managed Care, Other (non HMO) | Attending: Emergency Medicine | Admitting: Emergency Medicine

## 2016-03-28 ENCOUNTER — Emergency Department: Payer: Managed Care, Other (non HMO)

## 2016-03-28 ENCOUNTER — Encounter: Payer: Self-pay | Admitting: Emergency Medicine

## 2016-03-28 DIAGNOSIS — J45909 Unspecified asthma, uncomplicated: Secondary | ICD-10-CM | POA: Diagnosis not present

## 2016-03-28 DIAGNOSIS — J0120 Acute ethmoidal sinusitis, unspecified: Secondary | ICD-10-CM | POA: Insufficient documentation

## 2016-03-28 DIAGNOSIS — R42 Dizziness and giddiness: Secondary | ICD-10-CM

## 2016-03-28 DIAGNOSIS — J322 Chronic ethmoidal sinusitis: Secondary | ICD-10-CM

## 2016-03-28 DIAGNOSIS — R55 Syncope and collapse: Secondary | ICD-10-CM | POA: Diagnosis present

## 2016-03-28 HISTORY — DX: Anxiety disorder, unspecified: F41.9

## 2016-03-28 LAB — URINALYSIS COMPLETE WITH MICROSCOPIC (ARMC ONLY)
BILIRUBIN URINE: NEGATIVE
GLUCOSE, UA: NEGATIVE mg/dL
KETONES UR: NEGATIVE mg/dL
LEUKOCYTES UA: NEGATIVE
NITRITE: NEGATIVE
Protein, ur: NEGATIVE mg/dL
SPECIFIC GRAVITY, URINE: 1.014 (ref 1.005–1.030)
pH: 6 (ref 5.0–8.0)

## 2016-03-28 LAB — BASIC METABOLIC PANEL
ANION GAP: 8 (ref 5–15)
BUN: 12 mg/dL (ref 6–20)
CO2: 26 mmol/L (ref 22–32)
Calcium: 8.9 mg/dL (ref 8.9–10.3)
Chloride: 104 mmol/L (ref 101–111)
Creatinine, Ser: 0.88 mg/dL (ref 0.44–1.00)
Glucose, Bld: 87 mg/dL (ref 65–99)
POTASSIUM: 3.1 mmol/L — AB (ref 3.5–5.1)
SODIUM: 138 mmol/L (ref 135–145)

## 2016-03-28 LAB — CBC
HEMATOCRIT: 37.6 % (ref 35.0–47.0)
Hemoglobin: 13.1 g/dL (ref 12.0–16.0)
MCH: 31.8 pg (ref 26.0–34.0)
MCHC: 34.8 g/dL (ref 32.0–36.0)
MCV: 91.5 fL (ref 80.0–100.0)
Platelets: 228 10*3/uL (ref 150–440)
RBC: 4.11 MIL/uL (ref 3.80–5.20)
RDW: 12.8 % (ref 11.5–14.5)
WBC: 5 10*3/uL (ref 3.6–11.0)

## 2016-03-28 LAB — POC URINE PREG, ED: PREG TEST UR: NEGATIVE

## 2016-03-28 LAB — GLUCOSE, CAPILLARY: Glucose-Capillary: 83 mg/dL (ref 65–99)

## 2016-03-28 MED ORDER — PREDNISONE 10 MG (21) PO TBPK
ORAL_TABLET | ORAL | Status: AC
Start: 2016-03-28 — End: ?

## 2016-03-28 MED ORDER — AMOXICILLIN-POT CLAVULANATE 875-125 MG PO TABS: 1.0000 | ORAL_TABLET | Freq: Two times a day (BID) | ORAL | Status: AC

## 2016-03-28 NOTE — Discharge Instructions (Signed)
Dizziness °Dizziness is a common problem. It is a feeling of unsteadiness or light-headedness. You may feel like you are about to faint. Dizziness can lead to injury if you stumble or fall. Anyone can become dizzy, but dizziness is more common in older adults. This condition can be caused by a number of things, including medicines, dehydration, or illness. °HOME CARE INSTRUCTIONS °Taking these steps may help with your condition: °Eating and Drinking °· Drink enough fluid to keep your urine clear or pale yellow. This helps to keep you from becoming dehydrated. Try to drink more clear fluids, such as water. °· Do not drink alcohol. °· Limit your caffeine intake if directed by your health care provider. °· Limit your salt intake if directed by your health care provider. °Activity °· Avoid making quick movements. °· Rise slowly from chairs and steady yourself until you feel okay. °· In the morning, first sit up on the side of the bed. When you feel okay, stand slowly while you hold onto something until you know that your balance is fine. °· Move your legs often if you need to stand in one place for a long time. Tighten and relax your muscles in your legs while you are standing. °· Do not drive or operate heavy machinery if you feel dizzy. °· Avoid bending down if you feel dizzy. Place items in your home so that they are easy for you to reach without leaning over. °Lifestyle °· Do not use any tobacco products, including cigarettes, chewing tobacco, or electronic cigarettes. If you need help quitting, ask your health care provider. °· Try to reduce your stress level, such as with yoga or meditation. Talk with your health care provider if you need help. °General Instructions °· Watch your dizziness for any changes. °· Take medicines only as directed by your health care provider. Talk with your health care provider if you think that your dizziness is caused by a medicine that you are taking. °· Tell a friend or a family  member that you are feeling dizzy. If he or she notices any changes in your behavior, have this person call your health care provider. °· Keep all follow-up visits as directed by your health care provider. This is important. °SEEK MEDICAL CARE IF: °· Your dizziness does not go away. °· Your dizziness or light-headedness gets worse. °· You feel nauseous. °· You have reduced hearing. °· You have new symptoms. °· You are unsteady on your feet or you feel like the room is spinning. °SEEK IMMEDIATE MEDICAL CARE IF: °· You vomit or have diarrhea and are unable to eat or drink anything. °· You have problems talking, walking, swallowing, or using your arms, hands, or legs. °· You feel generally weak. °· You are not thinking clearly or you have trouble forming sentences. It may take a friend or family member to notice this. °· You have chest pain, abdominal pain, shortness of breath, or sweating. °· Your vision changes. °· You notice any bleeding. °· You have a headache. °· You have neck pain or a stiff neck. °· You have a fever. °  °This information is not intended to replace advice given to you by your health care provider. Make sure you discuss any questions you have with your health care provider. °  °Document Released: 03/06/2001 Document Revised: 01/25/2015 Document Reviewed: 09/06/2014 °Elsevier Interactive Patient Education ©2016 [employer] ° °Sinusitis, Adult °Sinusitis is redness, soreness, and inflammation of the paranasal sinuses. Paranasal sinuses are air pockets within   the bones of your face. They are located beneath your eyes, in the middle of your forehead, and above your eyes. In healthy paranasal sinuses, mucus is able to drain out, and air is able to circulate through them by way of your nose. However, when your paranasal sinuses are inflamed, mucus and air can become trapped. This can allow bacteria and other germs to grow and cause infection. °Sinusitis can develop quickly and last only a short time  (acute) or continue over a long period (chronic). Sinusitis that lasts for more than 12 weeks is considered chronic. °CAUSES °Causes of sinusitis include: °· Allergies. °· Structural abnormalities, such as displacement of the cartilage that separates your nostrils (deviated septum), which can decrease the air flow through your nose and sinuses and affect sinus drainage. °· Functional abnormalities, such as when the small hairs (cilia) that line your sinuses and help remove mucus do not work properly or are not present. °SIGNS AND SYMPTOMS °Symptoms of acute and chronic sinusitis are the same. The primary symptoms are pain and pressure around the affected sinuses. Other symptoms include: °· Upper toothache. °· Earache. °· Headache. °· Bad breath. °· Decreased sense of smell and taste. °· A cough, which worsens when you are lying flat. °· Fatigue. °· Fever. °· Thick drainage from your nose, which often is green and may contain pus (purulent). °· Swelling and warmth over the affected sinuses. °DIAGNOSIS °Your health care provider will perform a physical exam. During your exam, your health care provider may perform any of the following to help determine if you have acute sinusitis or chronic sinusitis: °· Look in your nose for signs of abnormal growths in your nostrils (nasal polyps). °· Tap over the affected sinus to check for signs of infection. °· View the inside of your sinuses using an imaging device that has a light attached (endoscope). °If your health care provider suspects that you have chronic sinusitis, one or more of the following tests may be recommended: °· Allergy tests. °· Nasal culture. A sample of mucus is taken from your nose, sent to a lab, and screened for bacteria. °· Nasal cytology. A sample of mucus is taken from your nose and examined by your health care provider to determine if your sinusitis is related to an allergy. °TREATMENT °Most cases of acute sinusitis are related to a viral infection  and will resolve on their own within 10 days. Sometimes, medicines are prescribed to help relieve symptoms of both acute and chronic sinusitis. These may include pain medicines, decongestants, nasal steroid sprays, or saline sprays. °However, for sinusitis related to a bacterial infection, your health care provider will prescribe antibiotic medicines. These are medicines that will help kill the bacteria causing the infection. °Rarely, sinusitis is caused by a fungal infection. In these cases, your health care provider will prescribe antifungal medicine. °For some cases of chronic sinusitis, surgery is needed. Generally, these are cases in which sinusitis recurs more than 3 times per year, despite other treatments. °HOME CARE INSTRUCTIONS °· Drink plenty of water. Water helps thin the mucus so your sinuses can drain more easily. °· Use a humidifier. °· Inhale steam 3-4 times a day (for example, sit in the bathroom with the shower running). °· Apply a warm, moist washcloth to your face 3-4 times a day, or as directed by your health care provider. °· Use saline nasal sprays to help moisten and clean your sinuses. °· Take medicines only as directed by your health care provider. °·   If you were prescribed either an antibiotic or antifungal medicine, finish it all even if you start to feel better. °SEEK IMMEDIATE MEDICAL CARE IF: °· You have increasing pain or severe headaches. °· You have nausea, vomiting, or drowsiness. °· You have swelling around your face. °· You have vision problems. °· You have a stiff neck. °· You have difficulty breathing. °  °This information is not intended to replace advice given to you by your health care provider. Make sure you discuss any questions you have with your health care provider. °  °Document Released: 09/10/2005 Document Revised: 10/01/2014 Document Reviewed: 09/25/2011 °Elsevier Interactive Patient Education ©2016 [employer] ° °

## 2016-03-28 NOTE — ED Provider Notes (Signed)
[facility] Emergency Department Provider Note        Time seen: ----------------------------------------- 1:14 PM on 03/28/2016 -----------------------------------------    I have reviewed the triage vital signs and the nursing notes.   HISTORY  Chief Complaint Near Syncope    HPI Wendy Collins is a 40 y.o. female who presents to ER with dizziness and near syncope. Patient describes dizziness like the room was moving or that her vision was distorted. She does have a distant history of migraines and does not describe her classic oral that she used to have. She denies any fevers or recent illness, denies nausea, vomiting or diarrhea. She describes a shooting pain in her head that is intermittent.   Past Medical History  Diagnosis Date  . Asthma   . Anxiety     There are no active problems to display for this patient.   Past Surgical History  Procedure Laterality Date  . Cesarean section      Allergies Sulfa antibiotics and Zoloft  Social History Social History  Substance Use Topics  . Smoking status: Never Smoker   . Smokeless tobacco: None  . Alcohol Use: No    Review of Systems Constitutional: Negative for fever. Eyes:Positive for vision changes ENT: Negative for sore throat. Cardiovascular: Negative for chest pain. Respiratory: Negative for shortness of breath. Gastrointestinal: Negative for abdominal pain, vomiting and diarrhea. Genitourinary: Negative for dysuria. Musculoskeletal: Negative for back pain. Skin: Negative for rash. Neurological: Positive for headache, dizziness  10-point ROS otherwise negative.  ____________________________________________   PHYSICAL EXAM:  VITAL SIGNS: ED Triage Vitals  Enc Vitals Group     BP 03/28/16 1234 126/87 mmHg     Pulse Rate 03/28/16 1234 72     Resp 03/28/16 1234 18     Temp 03/28/16 1234 98.1 F (36.7 C)     Temp src --      SpO2 03/28/16 1234 100 %     Weight  03/28/16 1234 130 lb (58.968 kg)     Height 03/28/16 1234 5' (1.524 m)     Head Cir --      Peak Flow --      Pain Score 03/28/16 1237 4     Pain Loc --      Pain Edu? --      Excl. in GC? --     Constitutional: Alert and oriented. Well appearing and in no distress. Eyes: Conjunctivae are normal. PERRL. Normal extraocular movements. ENT   Head: Normocephalic and atraumatic.   Nose: No congestion/rhinnorhea.   Mouth/Throat: Mucous membranes are moist.   Neck: No stridor. Cardiovascular: Normal rate, regular rhythm. No murmurs, rubs, or gallops. Respiratory: Normal respiratory effort without tachypnea nor retractions. Breath sounds are clear and equal bilaterally. No wheezes/rales/rhonchi. Gastrointestinal: Soft and nontender. Normal bowel sounds Musculoskeletal: Nontender with normal range of motion in all extremities. No lower extremity tenderness nor edema. Neurologic:  Normal speech and language. No gross focal neurologic deficits are appreciated.  Skin:  Skin is warm, dry and intact. No rash noted. Psychiatric: Mood and affect are normal. Speech and behavior are normal.  ____________________________________________  EKG: Interpreted by me. Sinus rhythm with a rate of 87 bpm, normal PR interval, normal QRS, normal QT interval. Normal axis, possible right atrial enlargement  ____________________________________________  ED COURSE:  Pertinent labs & imaging results that were available during my care of the patient were reviewed by me and considered in my medical decision making (see chart for details). Patient  presents to ER with vague symptoms of dizziness. I will check basic labs and imaging. ____________________________________________    LABS (pertinent positives/negatives)  Labs Reviewed  BASIC METABOLIC PANEL - Abnormal; Notable for the following:    Potassium 3.1 (*)    All other components within normal limits  URINALYSIS COMPLETEWITH MICROSCOPIC ([facility]  ONLY) - Abnormal; Notable for the following:    Color, Urine YELLOW (*)    APPearance CLEAR (*)    Hgb urine dipstick 1+ (*)    Bacteria, UA RARE (*)    Squamous Epithelial / LPF 0-5 (*)    All other components within normal limits  CBC  GLUCOSE, CAPILLARY  CBG MONITORING, ED  POC URINE PREG, ED    RADIOLOGY Images were viewed by me  CT head IMPRESSION: 1. Negative intracranial imaging. 2. Extensive bilateral fronto-ethmoid sinusitis. ____________________________________________  FINAL ASSESSMENT AND PLAN  Dizziness, Sinusitis  Plan: Patient with labs and imaging as dictated above. Patient be given prednisone taper as well as antibiotics to try to alleviate her symptoms. Extensive sinus disease is likely the cause of her symptoms. She stable for outpatient follow-up with ENT as referred.   Emily FilbertWilliams, Terez Freimark E, MD   Note: This dictation was prepared with Dragon dictation. Any transcriptional errors that result from this process are unintentional   Emily FilbertJonathan E Derica Leiber, MD 03/28/16 1423

## 2016-03-28 NOTE — ED Notes (Signed)
Pt presents with sudden onset dizziness and feeling like she was going to pass out at work today. Pt alert and oriented in triage. Neuro exam WNL. Pt denies nausea, vomiting, diarrhea. Pt denies any other symptoms of illness.
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
## 2016-09-04 IMAGING — CR DG CHEST 2V
1 series · 2 of 2 positions shown · non-contrast
Comparison: 09/09/2015

CLINICAL DATA: Intermittent left-sided chest pain since [DATE].

EXAM:
CHEST  2 VIEW

[Series 1: dg chest 2 view · 0.14mm/px · 2 of 2 slices shown]
[im 1/2]
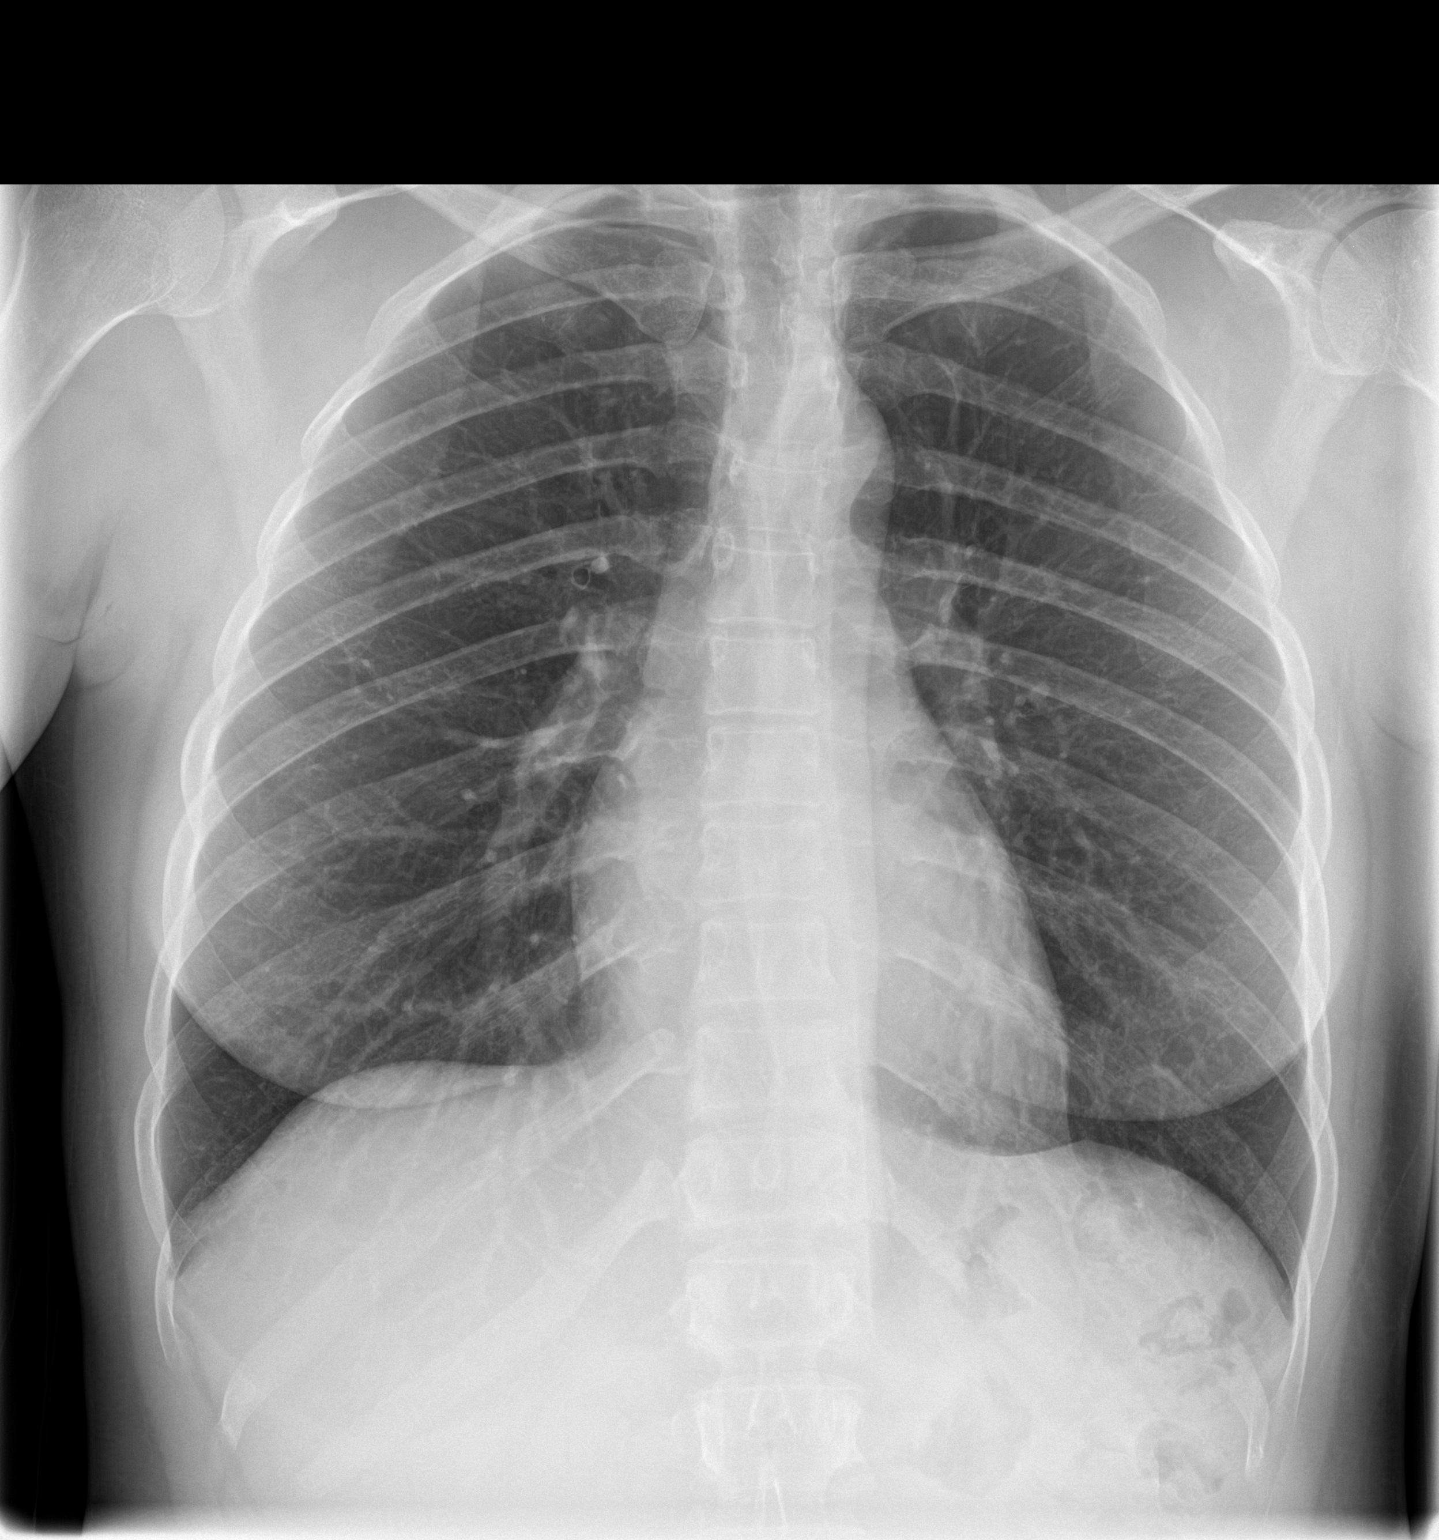
[im 2/2]
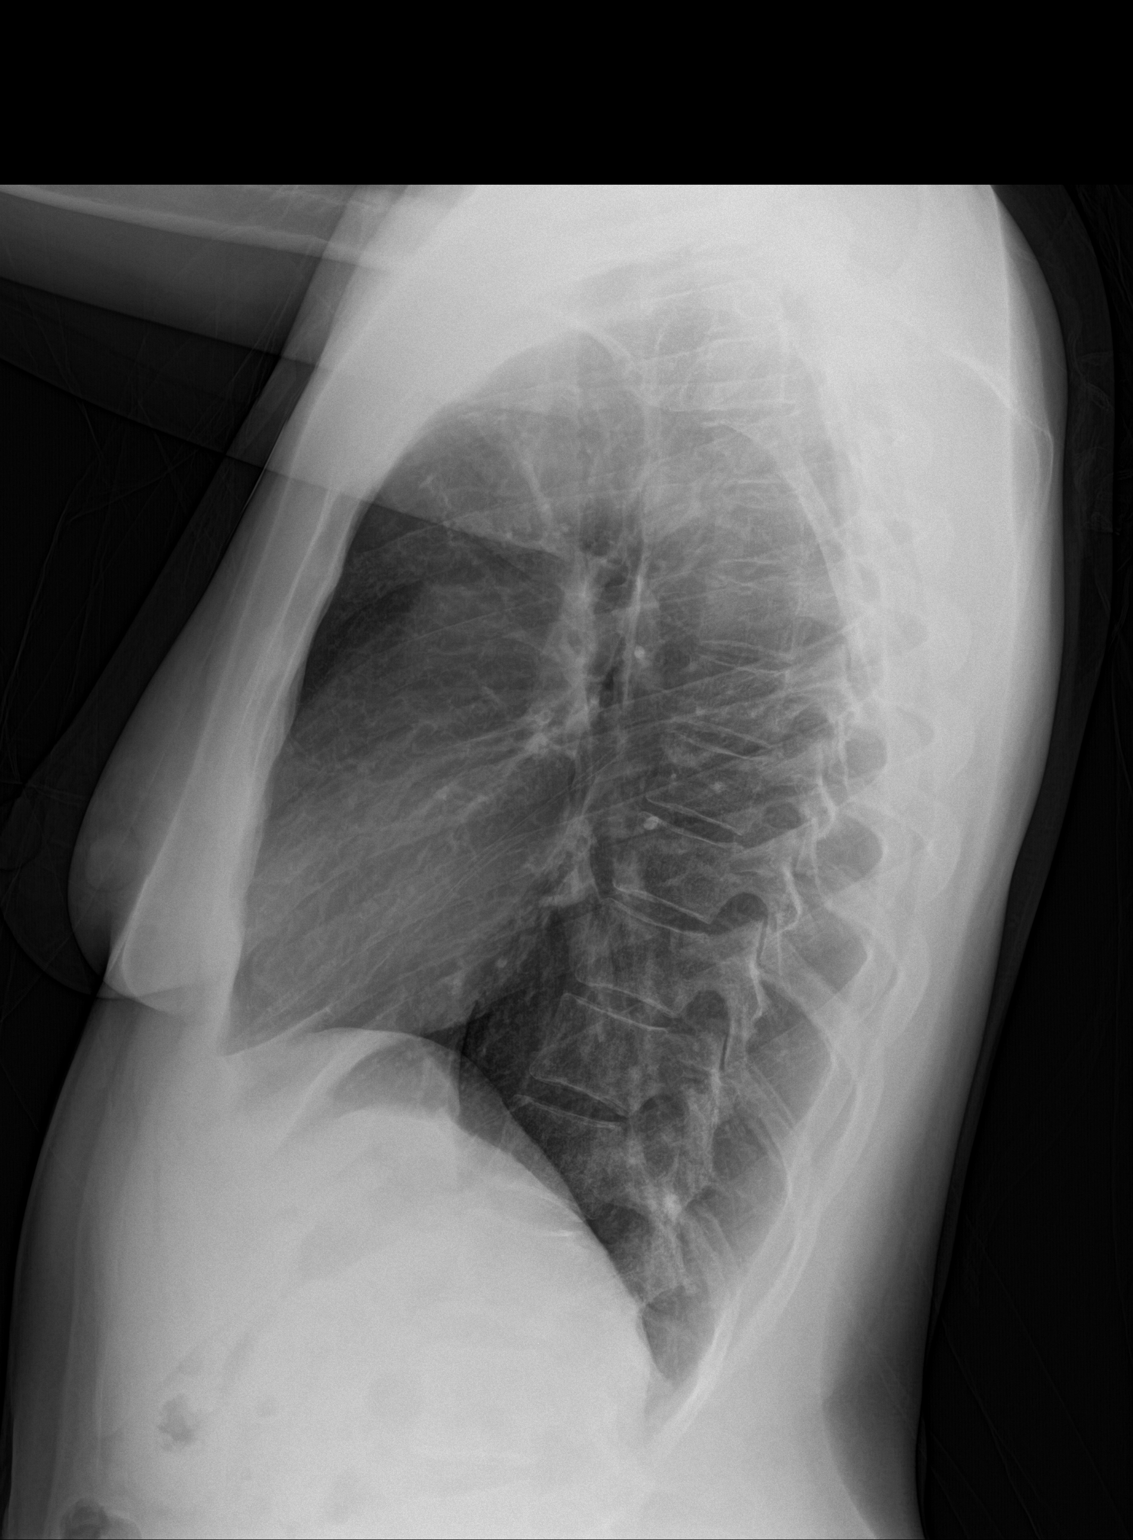

[2 of 2 positions shown; findings below may reference images not displayed]

FINDINGS: The heart size and mediastinal contours are within normal limits.
Both lungs are clear. The visualized skeletal structures are
unremarkable.
IMPRESSION: No active cardiopulmonary disease.
